# Patient Record
Sex: Female | Born: 2005
Health system: Southern US, Community
[De-identification: ages and names within clinical notes are randomized; demographics above are authoritative.]

## PROBLEM LIST (undated history)

## (undated) DIAGNOSIS — T7840XA Allergy, unspecified, initial encounter: Secondary | ICD-10-CM

## (undated) HISTORY — DX: Allergy, unspecified, initial encounter: T78.40XA

---
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
## 2023-04-26 ENCOUNTER — Ambulatory Visit (HOSPITAL_COMMUNITY): Payer: Self-pay

## 2023-04-26 ENCOUNTER — Ambulatory Visit (INDEPENDENT_AMBULATORY_CARE_PROVIDER_SITE_OTHER): Payer: Commercial Managed Care - PPO | Admitting: Pediatrics

## 2023-04-26 ENCOUNTER — Encounter: Payer: Self-pay | Admitting: Pediatrics

## 2023-04-26 DIAGNOSIS — Z111 Encounter for screening for respiratory tuberculosis: Secondary | ICD-10-CM | POA: Diagnosis not present

## 2023-04-26 NOTE — Progress Notes (Signed)
  Provided patient w/ verbal instructions concerning pre-, intra- and post-procedure preparation and instructions.  Patient verbalized understanding of the above.    Tara Clayton  is an 17 y.o. female who is here for placement of PPD skin test as needed for school/work.   Bleb was placed into forearm.   Patient advised to return at 48 hours to have skin test read. Agreeable to plan.

## 2023-04-28 ENCOUNTER — Ambulatory Visit (INDEPENDENT_AMBULATORY_CARE_PROVIDER_SITE_OTHER): Payer: Self-pay | Admitting: Pediatrics

## 2023-04-28 DIAGNOSIS — Z111 Encounter for screening for respiratory tuberculosis: Secondary | ICD-10-CM

## 2023-04-28 LAB — TB SKIN TEST: TB Skin Test: NEGATIVE

## 2023-04-30 ENCOUNTER — Encounter: Payer: Self-pay | Admitting: Pediatrics

## 2023-04-30 DIAGNOSIS — Z111 Encounter for screening for respiratory tuberculosis: Secondary | ICD-10-CM | POA: Insufficient documentation

## 2023-04-30 NOTE — Progress Notes (Signed)
  Tara Clayton  is a 17 y.o. female who is here for reading of PPD skin test as needed for school/work.   Bleb was placed into left forearm.   Read at 0 mm by me, Georgiann Hahn, MD. Marybelle Killings completed as needed.

## 2023-05-23 ENCOUNTER — Ambulatory Visit: Payer: Self-pay | Admitting: Pediatrics

## 2023-05-30 ENCOUNTER — Ambulatory Visit: Payer: Commercial Managed Care - PPO | Admitting: Pediatrics

## 2023-05-30 VITALS — BP 110/68 | Ht 64.0 in | Wt 156.2 lb

## 2023-05-30 DIAGNOSIS — Z68.41 Body mass index (BMI) pediatric, 5th percentile to less than 85th percentile for age: Secondary | ICD-10-CM | POA: Diagnosis not present

## 2023-05-30 DIAGNOSIS — Z0000 Encounter for general adult medical examination without abnormal findings: Secondary | ICD-10-CM | POA: Diagnosis not present

## 2023-05-30 DIAGNOSIS — Z23 Encounter for immunization: Secondary | ICD-10-CM

## 2023-05-30 DIAGNOSIS — Z1339 Encounter for screening examination for other mental health and behavioral disorders: Secondary | ICD-10-CM | POA: Diagnosis not present

## 2023-05-30 LAB — CBC WITH DIFFERENTIAL/PLATELET
Absolute Monocytes: 643 cells/uL (ref 200–950)
Monocytes Relative: 6.3 %
Neutro Abs: 6661 cells/uL (ref 1500–7800)
Neutrophils Relative %: 65.3 %
Platelets: 355 10*3/uL (ref 140–400)
RBC: 4.84 10*6/uL (ref 3.80–5.10)

## 2023-05-30 NOTE — Patient Instructions (Signed)
Preventive Care 18-19 Years Old, Female Preventive care refers to lifestyle choices and visits with your health care provider that can promote health and wellness. At this stage in your life, you may start seeing a primary care physician instead of a pediatrician for your preventive care. Preventive care visits are also called wellness exams. What can I expect for my preventive care visit? Counseling During your preventive care visit, your health care provider may ask about your: Medical history, including: Past medical problems. Family medical history. Pregnancy history. Current health, including: Menstrual cycle. Method of birth control. Emotional well-being. Home life and relationship well-being. Sexual activity and sexual health. Lifestyle, including: Alcohol, nicotine or tobacco, and drug use. Access to firearms. Diet, exercise, and sleep habits. Sunscreen use. Motor vehicle safety. Physical exam Your health care provider may check your: Height and weight. These may be used to calculate your BMI (body mass index). BMI is a measurement that tells if you are at a healthy weight. Waist circumference. This measures the distance around your waistline. This measurement also tells if you are at a healthy weight and may help predict your risk of certain diseases, such as type 2 diabetes and high blood pressure. Heart rate and blood pressure. Body temperature. Skin for abnormal spots. Breasts. What immunizations do I need?  Vaccines are usually given at various ages, according to a schedule. Your health care provider will recommend vaccines for you based on your age, medical history, and lifestyle or other factors, such as travel or where you work. What tests do I need? Screening Your health care provider may recommend screening tests for certain conditions. This may include: Vision and hearing tests. Lipid and cholesterol levels. Pelvic exam and Pap test. Hepatitis B  test. Hepatitis C test. HIV (human immunodeficiency virus) test. STI (sexually transmitted infection) testing, if you are at risk. Tuberculosis skin test if you have symptoms. BRCA-related cancer screening. This may be done if you have a family history of breast, ovarian, tubal, or peritoneal cancers. Talk with your health care provider about your test results, treatment options, and if necessary, the need for more tests. Follow these instructions at home: Eating and drinking  Eat a healthy diet that includes fresh fruits and vegetables, whole grains, lean protein, and low-fat dairy products. Drink enough fluid to keep your urine pale yellow. Do not drink alcohol if: Your health care provider tells you not to drink. You are pregnant, may be pregnant, or are planning to become pregnant. You are under the legal drinking age. In the [place] the legal drinking age is 21. If you drink alcohol: Limit how much you have to 0-1 drink a day. Know how much alcohol is in your drink. In the [place] one drink equals one 12 oz bottle of beer (355 mL), one 5 oz glass of wine (148 mL), or one 1 oz glass of hard liquor (44 mL). Lifestyle Brush your teeth every morning and night with fluoride toothpaste. Floss one time each day. Exercise for at least 30 minutes 5 or more days of the week. Do not use any products that contain nicotine or tobacco. These products include cigarettes, chewing tobacco, and vaping devices, such as e-cigarettes. If you need help quitting, ask your health care provider. Do not use drugs. If you are sexually active, practice safe sex. Use a condom or other form of protection to prevent STIs. If you do not wish to become pregnant, use a form of birth control. If you plan to become pregnant,   see your health care provider for a prepregnancy visit. Find healthy ways to manage stress, such as: Meditation, yoga, or listening to music. Journaling. Talking to a trusted person. Spending time  with friends and family. Safety Always wear your seat belt while driving or riding in a vehicle. Do not drive: If you have been drinking alcohol. Do not ride with someone who has been drinking. When you are tired or distracted. While texting. If you have been using any mind-altering substances or drugs. Wear a helmet and other protective equipment during sports activities. If you have firearms in your house, make sure you follow all gun safety procedures. Seek help if you have been bullied, physically abused, or sexually abused. Use the internet responsibly to avoid dangers, such as online bullying and online sex predators. What's next? Go to your health care provider once a year for an annual wellness visit. Ask your health care provider how often you should have your eyes and teeth checked. Stay up to date on all vaccines. This information is not intended to replace advice given to you by your health care provider. Make sure you discuss any questions you have with your health care provider. Document Revised: 04/21/2021 Document Reviewed: 04/21/2021 Elsevier Patient Education  2024 [employer]  

## 2023-05-30 NOTE — Progress Notes (Signed)
Tara Clayton ---[facility]  Adolescent Well Care Visit Tara Clayton is a 17 y.o. female who is here for well care.    PCP:  Georgiann Hahn, MD   History was provided by the patient and mother.  Confidentiality was discussed with the patient and, if applicable, with caregiver as well.    Current Issues: Current concerns include: none  Nutrition: Nutrition/Eating Behaviors: good Adequate calcium in diet?: yes Supplements/ Vitamins: yes  Exercise/ Media: Play any Sports?/ Exercise: yes Screen Time:  < 2 hours Media Rules or Monitoring?: yes  Sleep:  Sleep: >8 hours  Social Screening: Lives with:  parents Parental relations:  good Activities, Work, and Regulatory affairs officer?: school Concerns regarding behavior with peers?  no Stressors of note: no  Education:   School Grade: 12 School performance: doing well; no concerns School Behavior: doing well; no concerns   Confidential Social History: Tobacco?  no Secondhand smoke exposure?  no Drugs/ETOH?  no  Sexually Active?  no   Pregnancy Prevention: n/a  Safe at home, in school & in relationships?  Yes Safe to self?  Yes   Screenings: Patient has a dental home: yes  The following were discussed: eating habits, exercise habits, safety equipment use, bullying, abuse and/or trauma, weapon use, tobacco use, other substance use, reproductive health, and mental health.  Issues were addressed and counseling provided.    Additional topics were addressed as anticipatory guidance.  PHQ-9 completed and results indicated no risks  Physical Exam:  Vitals:   06/01/23 0858  BP: 110/68  Weight: 156 lb 3.2 oz (70.9 kg)  Height: 5\' 4"  (1.626 m)   BP 110/68   Ht 5\' 4"  (1.626 m)   Wt 156 lb 3.2 oz (70.9 kg)   BMI 26.81 kg/m  Body mass index: body mass index is 26.81 kg/m. Blood pressure %iles are not available for patients who are 18 years or older.  Hearing Screening   500Hz  1000Hz  2000Hz  3000Hz  4000Hz  5000Hz   Right  ear 20 20 20 20 20 20   Left ear 20 20 20 20 20 20    Vision Screening   Right eye Left eye Both eyes  Without correction 10/10 10/10   With correction       General Appearance:   alert, oriented, no acute distress and well nourished  HENT: Normocephalic, no obvious abnormality, conjunctiva clear  Mouth:   Normal appearing teeth, no obvious discoloration, dental caries, or dental caps  Neck:   Supple; thyroid: no enlargement, symmetric, no tenderness/mass/nodules  Chest deferred  Lungs:   Clear to auscultation bilaterally, normal work of breathing  Heart:   Regular rate and rhythm, S1 and S2 normal, no murmurs;   Abdomen:   Soft, non-tender, no mass, or organomegaly  GU deferred  Musculoskeletal:   Tone and strength strong and symmetrical, all extremities               Lymphatic:   No cervical adenopathy  Skin/Hair/Nails:   Skin warm, dry and intact, no rashes, no bruises or petechiae  Neurologic:   Strength, gait, and coordination normal and age-appropriate     Assessment and Plan:   Well adolescent female   BMI is appropriate for age  Hearing screening result:normal Vision screening result: normal  Orders Placed This Encounter  Procedures   Meningococcal B, OMV   CBC with Differential/Platelet   Comprehensive metabolic panel   T4, free   TSH   Lipid panel     Return in about 4 months (  around 09/30/2023).Marland Kitchen  Georgiann Hahn, MD

## 2023-05-31 LAB — CBC WITH DIFFERENTIAL/PLATELET
Basophils Absolute: 61 cells/uL (ref 0–200)
Basophils Relative: 0.6 %
Eosinophils Absolute: 122 cells/uL (ref 15–500)
Eosinophils Relative: 1.2 %
HCT: 42.4 % (ref 35.0–45.0)
Hemoglobin: 13.8 g/dL (ref 11.7–15.5)
Lymphs Abs: 2713 cells/uL (ref 850–3900)
MCH: 28.5 pg (ref 27.0–33.0)
MCHC: 32.5 g/dL (ref 32.0–36.0)
MCV: 87.6 fL (ref 80.0–100.0)
MPV: 10.2 fL (ref 7.5–12.5)
RDW: 12.3 % (ref 11.0–15.0)
Total Lymphocyte: 26.6 %
WBC: 10.2 10*3/uL (ref 3.8–10.8)

## 2023-05-31 LAB — T4, FREE: Free T4: 1.4 ng/dL (ref 0.8–1.4)

## 2023-05-31 LAB — COMPREHENSIVE METABOLIC PANEL
AG Ratio: 1.6 (calc) (ref 1.0–2.5)
ALT: 20 U/L (ref 5–32)
AST: 15 U/L (ref 12–32)
Albumin: 4.4 g/dL (ref 3.6–5.1)
Alkaline phosphatase (APISO): 55 U/L (ref 36–128)
BUN: 16 mg/dL (ref 7–20)
CO2: 23 mmol/L (ref 20–32)
Calcium: 10.1 mg/dL (ref 8.9–10.4)
Chloride: 105 mmol/L (ref 98–110)
Creat: 0.9 mg/dL (ref 0.50–0.96)
Globulin: 2.8 g/dL (calc) (ref 2.0–3.8)
Glucose, Bld: 99 mg/dL (ref 65–99)
Potassium: 5 mmol/L (ref 3.8–5.1)
Sodium: 139 mmol/L (ref 135–146)
Total Bilirubin: 0.4 mg/dL (ref 0.2–1.1)
Total Protein: 7.2 g/dL (ref 6.3–8.2)

## 2023-05-31 LAB — LIPID PANEL
Cholesterol: 206 mg/dL — ABNORMAL HIGH (ref <170)
HDL: 75 mg/dL (ref >45)
LDL Cholesterol (Calc): 112 mg/dL (calc) — ABNORMAL HIGH (ref <110)
Non-HDL Cholesterol (Calc): 131 mg/dL (calc) — ABNORMAL HIGH (ref <120)
Total CHOL/HDL Ratio: 2.7 (calc) (ref <5.0)
Triglycerides: 88 mg/dL (ref <90)

## 2023-05-31 LAB — TSH: TSH: 1.77 mIU/L

## 2023-06-02 ENCOUNTER — Encounter: Payer: Self-pay | Admitting: Pediatrics

## 2023-07-18 ENCOUNTER — Encounter: Payer: Self-pay | Admitting: Pediatrics
